# Patient Record
Sex: Female | Born: 1995 | Race: Black or African American | Hispanic: No | Marital: Single | Smoking: Former smoker
Health system: Southern US, Community
[De-identification: ages and names within clinical notes are randomized; demographics above are authoritative.]

## PROBLEM LIST (undated history)

## (undated) HISTORY — PX: WISDOM TOOTH EXTRACTION: SHX21

---

## 2016-08-15 ENCOUNTER — Encounter (HOSPITAL_BASED_OUTPATIENT_CLINIC_OR_DEPARTMENT_OTHER): Payer: Self-pay

## 2016-08-15 ENCOUNTER — Emergency Department (HOSPITAL_BASED_OUTPATIENT_CLINIC_OR_DEPARTMENT_OTHER): Payer: Medicaid Other

## 2016-08-15 ENCOUNTER — Emergency Department (HOSPITAL_BASED_OUTPATIENT_CLINIC_OR_DEPARTMENT_OTHER)
Admission: EM | Admit: 2016-08-15 | Discharge: 2016-08-16 | Disposition: A | Discharge status: Home / Self Care | Payer: Medicaid Other | Attending: Emergency Medicine | Admitting: Emergency Medicine

## 2016-08-15 DIAGNOSIS — R0602 Shortness of breath: Secondary | ICD-10-CM | POA: Insufficient documentation

## 2016-08-15 DIAGNOSIS — F17210 Nicotine dependence, cigarettes, uncomplicated: Secondary | ICD-10-CM | POA: Diagnosis not present

## 2016-08-15 DIAGNOSIS — R55 Syncope and collapse: Secondary | ICD-10-CM | POA: Insufficient documentation

## 2016-08-15 DIAGNOSIS — R109 Unspecified abdominal pain: Secondary | ICD-10-CM | POA: Diagnosis not present

## 2016-08-15 LAB — CBC WITH DIFFERENTIAL/PLATELET
Basophils Absolute: 0 10*3/uL (ref 0.0–0.1)
Basophils Relative: 0 %
EOS PCT: 0 %
Eosinophils Absolute: 0 10*3/uL (ref 0.0–0.7)
HCT: 43.6 % (ref 36.0–46.0)
Hemoglobin: 14.5 g/dL (ref 12.0–15.0)
LYMPHS ABS: 1.8 10*3/uL (ref 0.7–4.0)
LYMPHS PCT: 14 %
MCH: 31.3 pg (ref 26.0–34.0)
MCHC: 33.3 g/dL (ref 30.0–36.0)
MCV: 94.2 fL (ref 78.0–100.0)
MONO ABS: 1.2 10*3/uL — AB (ref 0.1–1.0)
Monocytes Relative: 9 %
Neutro Abs: 9.8 10*3/uL — ABNORMAL HIGH (ref 1.7–7.7)
Neutrophils Relative %: 77 %
PLATELETS: 277 10*3/uL (ref 150–400)
RBC: 4.63 MIL/uL (ref 3.87–5.11)
RDW: 12.5 % (ref 11.5–15.5)
WBC: 12.8 10*3/uL — ABNORMAL HIGH (ref 4.0–10.5)

## 2016-08-15 LAB — COMPREHENSIVE METABOLIC PANEL
ALT: 12 U/L — ABNORMAL LOW (ref 14–54)
AST: 20 U/L (ref 15–41)
Albumin: 4.2 g/dL (ref 3.5–5.0)
Alkaline Phosphatase: 53 U/L (ref 38–126)
Anion gap: 6 (ref 5–15)
BUN: 13 mg/dL (ref 6–20)
CHLORIDE: 104 mmol/L (ref 101–111)
CO2: 26 mmol/L (ref 22–32)
Calcium: 9.2 mg/dL (ref 8.9–10.3)
Creatinine, Ser: 0.6 mg/dL (ref 0.44–1.00)
GFR calc Af Amer: >60 mL/min (ref >60)
GFR calc non Af Amer: >60 mL/min (ref >60)
GLUCOSE: 117 mg/dL — AB (ref 65–99)
POTASSIUM: 3.7 mmol/L (ref 3.5–5.1)
Sodium: 136 mmol/L (ref 135–145)
Total Bilirubin: 0.6 mg/dL (ref 0.3–1.2)
Total Protein: 7.8 g/dL (ref 6.5–8.1)

## 2016-08-15 LAB — URINALYSIS, ROUTINE W REFLEX MICROSCOPIC
Bilirubin Urine: NEGATIVE
Glucose, UA: NEGATIVE mg/dL
Hgb urine dipstick: NEGATIVE
KETONES UR: NEGATIVE mg/dL
LEUKOCYTES UA: NEGATIVE
NITRITE: NEGATIVE
PROTEIN: NEGATIVE mg/dL
Specific Gravity, Urine: 1.028 (ref 1.005–1.030)
pH: 5.5 (ref 5.0–8.0)

## 2016-08-15 LAB — PREGNANCY, URINE: Preg Test, Ur: NEGATIVE

## 2016-08-15 LAB — CBG MONITORING, ED: GLUCOSE-CAPILLARY: 77 mg/dL (ref 65–99)

## 2016-08-15 MED ORDER — SODIUM CHLORIDE 0.9 % IV BOLUS (SEPSIS)
500.0000 mL | Freq: Once | INTRAVENOUS | Status: AC
  Administered 2016-08-15: 500 mL via INTRAVENOUS

## 2016-08-15 MED ORDER — IOPAMIDOL (ISOVUE-300) INJECTION 61%
100.0000 mL | Freq: Once | INTRAVENOUS | Status: AC | PRN
  Administered 2016-08-15: 100 mL via INTRAVENOUS

## 2016-08-15 MED ORDER — ONDANSETRON HCL 4 MG/2ML IJ SOLN
4.0000 mg | Freq: Once | INTRAMUSCULAR | Status: AC
  Administered 2016-08-15: 4 mg via INTRAVENOUS
  Filled 2016-08-15: qty 2

## 2016-08-15 NOTE — ED Provider Notes (Signed)
[facility]   CSN: [id number] Arrival date & time: 08/15/16  1859   By signing my name below, I, Clovis PuAvnee Patel, attest that this documentation has been prepared under the direction and in the presence of Tilden FossaElizabeth Floreen Teegarden, MD  Electronically Signed: Clovis PuAvnee Patel, ED Scribe. 08/15/16. 10:26 PM.   History   Chief Complaint Chief Complaint  Patient presents with  . Loss of Consciousness   The history is provided by the patient. No language interpreter was used.   HPI Comments:  Kimberly Bell is a 21 y.o. female who presents to the Emergency Department s/p a syncopal episode which occurred PTA. Pt states she felt hot before she passed out. She also reports rib pain, abdominal pain, SOB upon walking and notes she did not eat the normal amount of food that she usually does today. Her abdominal pain is worse upon palpation. No alleviating factors noted. Pt denies chest pain, leg swelling, any major medical problems, daily medication use, drug allergies or any other associated symptoms.   History reviewed. No pertinent past medical history.  There are no active problems to display for this patient.   Past Surgical History:  Procedure Laterality Date  . WISDOM TOOTH EXTRACTION      OB History    No data available       Home Medications    Prior to Admission medications   Not on File    Family History No family history on file.  Social History Social History  Substance Use Topics  . Smoking status: Current Every Day Smoker    Types: Cigarettes  . Smokeless tobacco: Never Used  . Alcohol use No     Allergies   Patient has no known allergies.   Review of Systems Review of Systems  Respiratory: Positive for shortness of breath.   Cardiovascular: Positive for syncope. Negative for chest pain and leg swelling.  Gastrointestinal: Positive for abdominal pain.  Musculoskeletal: Positive for myalgias.  Neurological: Positive for syncope.  All  other systems reviewed and are negative.  Physical Exam Updated Vital Signs BP 113/60 (BP Location: Right Arm)   Pulse 72   Temp 98.9 F (37.2 C) (Oral)   Resp 18   Ht 5\' 8"  (1.727 m)   Wt 125 lb (56.7 kg)   LMP 07/28/2016   SpO2 100%   BMI 19.01 kg/m   Physical Exam  Constitutional: She is oriented to person, place, and time. She appears well-developed and well-nourished.  HENT:  Head: Normocephalic and atraumatic.  Cardiovascular: Normal rate and regular rhythm.   No murmur heard. Pulmonary/Chest: Effort normal and breath sounds normal. No respiratory distress.  Abdominal: Soft. There is no tenderness. There is no rebound and no guarding.  Moderate right sided abdominal tenderness.   Musculoskeletal: She exhibits no edema or tenderness.  Neurological: She is alert and oriented to person, place, and time.  Skin: Skin is warm and dry.  Psychiatric: She has a normal mood and affect. Her behavior is normal.  Nursing note and vitals reviewed.  ED Treatments / Results  DIAGNOSTIC STUDIES:  Oxygen Saturation is 100% on RA, normal by my interpretation.    COORDINATION OF CARE:  10:25 PM Discussed treatment plan with pt at bedside and pt agreed to plan.  Labs (all labs ordered are listed, but only abnormal results are displayed) Labs Reviewed  COMPREHENSIVE METABOLIC PANEL - Abnormal; Notable for the following:       Result Value   Glucose, Bld 117 (*)  ALT 12 (*)    All other components within normal limits  CBC WITH DIFFERENTIAL/PLATELET - Abnormal; Notable for the following:    WBC 12.8 (*)    Neutro Abs 9.8 (*)    Monocytes Absolute 1.2 (*)    All other components within normal limits  PREGNANCY, URINE  URINALYSIS, ROUTINE W REFLEX MICROSCOPIC  CBG MONITORING, ED    EKG  EKG Interpretation  Date/Time:  Monday August 15 2016 19:28:37 EST Ventricular Rate:  81 PR Interval:  144 QRS Duration: 74 QT Interval:  360 QTC Calculation: 418 R Axis:   93 Text  Interpretation:  Normal sinus rhythm with sinus arrhythmia Possible Left atrial enlargement Rightward axis Borderline ECG Confirmed by Lincoln Brigham [phone]) on 08/15/2016 10:24:03 PM       Radiology No results found.  Procedures Procedures (including critical care time)  Medications Ordered in ED Medications  ondansetron (ZOFRAN) injection 4 mg (not administered)  iopamidol (ISOVUE-300) 61 % injection 100 mL (not administered)  sodium chloride 0.9 % bolus 500 mL (500 mLs Intravenous New Bag/Given 08/15/16 2241)     Initial Impression / Assessment and Plan / ED Course  I have reviewed the triage vital signs and the nursing notes.  Pertinent labs & imaging results that were available during my care of the patient were reviewed by me and considered in my medical decision making (see chart for details).     Patient here for a syncopal event while working. She had some nausea and right-sided abdominal pain on ED arrival. Her symptoms did improve after IV fluids and antiemetic. Current clinical picture is not consistent with PE. CT abdomen obtained given her leukocytosis and right-sided abdominal tenderness, CT is negative for acute [facility]. Consultation on home care for syncope as well as abdominal pain. Discussed importance of PCP follow-up and return precautions.  Final Clinical Impressions(s) / ED Diagnoses   Final diagnoses:  Syncope, unspecified syncope type    New Prescriptions New Prescriptions   No medications on file  I personally performed the services described in this documentation, which was scribed in my presence. The recorded information has been reviewed and is accurate.    Tilden Fossa, MD 08/16/16 [facility]

## 2016-08-15 NOTE — ED Triage Notes (Signed)
Pt states she was at work-she slumped over on co-worker then "came to"-did not fall to the ground-denies any recent illness-last po intake 245pm-NAD-steady gait

## 2018-03-13 ENCOUNTER — Ambulatory Visit (INDEPENDENT_AMBULATORY_CARE_PROVIDER_SITE_OTHER): Payer: PRIVATE HEALTH INSURANCE

## 2018-03-13 ENCOUNTER — Encounter (HOSPITAL_COMMUNITY): Payer: Self-pay | Admitting: Emergency Medicine

## 2018-03-13 ENCOUNTER — Ambulatory Visit (HOSPITAL_COMMUNITY)
Admission: EM | Admit: 2018-03-13 | Discharge: 2018-03-13 | Disposition: A | Discharge status: Home / Self Care | Payer: PRIVATE HEALTH INSURANCE | Attending: Family Medicine | Admitting: Family Medicine

## 2018-03-13 ENCOUNTER — Other Ambulatory Visit: Payer: Self-pay

## 2018-03-13 DIAGNOSIS — W228XXA Striking against or struck by other objects, initial encounter: Secondary | ICD-10-CM

## 2018-03-13 DIAGNOSIS — M25531 Pain in right wrist: Secondary | ICD-10-CM

## 2018-03-13 DIAGNOSIS — S60221A Contusion of right hand, initial encounter: Secondary | ICD-10-CM

## 2018-03-13 DIAGNOSIS — M79641 Pain in right hand: Secondary | ICD-10-CM

## 2018-03-13 MED ORDER — IBUPROFEN 600 MG PO TABS: 600.0000 mg | ORAL_TABLET | Freq: Four times a day (QID) | ORAL | 0 refills | Status: AC | PRN

## 2018-03-13 NOTE — ED Triage Notes (Signed)
Pt reports punching a wall last night.  She had swelling to the right hand and wrist last night.  She now has pain to the wrist and the top of her hand.

## 2018-03-13 NOTE — Discharge Instructions (Addendum)
No fracture or dislocation seen on x-ray Use anti-inflammatories for pain/swelling. You may take up to 800 mg Ibuprofen every 8 hours with food. You may supplement Ibuprofen with Tylenol [phone] mg every 8 hours.  Ice Would expect gradual improvement over the next 1 to 2 weeks

## 2018-03-13 NOTE — ED Provider Notes (Signed)
[facility]    CSN: [id number] Arrival date & time: 03/13/18  1140     History   Chief Complaint Chief Complaint  Patient presents with  . Hand Injury    right    HPI Kimberly Bell is a 22 y.o. female no significant past medical history presenting today for evaluation of right hand and wrist pain.  Patient states that she punched a wall last night.  Since she has had swelling and pain.  Had some slight numbness last night, but this is resolved.  Feels as if the swelling has improved.  She continues to have pain to her hand and wrist.  Has previous issues with this hand and wrist.  Patient is right-handed.  HPI  History reviewed. No pertinent past medical history.  There are no active problems to display for this patient.   Past Surgical History:  Procedure Laterality Date  . WISDOM TOOTH EXTRACTION      OB History   None      Home Medications    Prior to Admission medications   Medication Sig Start Date End Date Taking? Authorizing Provider  FLUoxetine (PROZAC) 10 MG capsule Take by mouth. 01/20/18  Yes [provider]  ibuprofen (ADVIL,MOTRIN) 600 MG tablet Take 1 tablet (600 mg total) by mouth every 6 (six) hours as needed. 03/13/18   Yakov Bergen, Junius Creamer, PA-C    Family History History reviewed. No pertinent family history.  Social History Social History   Tobacco Use  . Smoking status: Current Every Day Smoker    Types: Cigarettes  . Smokeless tobacco: Never Used  Substance Use Topics  . Alcohol use: No  . Drug use: No     Allergies   Patient has no known allergies.   Review of Systems Review of Systems  Constitutional: Negative for fatigue and fever.  Eyes: Negative for visual disturbance.  Respiratory: Negative for shortness of breath.   Cardiovascular: Negative for chest pain.  Gastrointestinal: Negative for abdominal pain, nausea and vomiting.  Musculoskeletal: Positive for arthralgias and myalgias. Negative for joint  swelling.  Skin: Negative for color change, rash and wound.  Neurological: Negative for dizziness, weakness, light-headedness and headaches.     Physical Exam Triage Vital Signs ED Triage Vitals  Enc Vitals Group     BP 03/13/18 1216 102/60     Pulse Rate 03/13/18 1216 88     Resp --      Temp 03/13/18 1216 98.5 F (36.9 C)     Temp Source 03/13/18 1216 Oral     SpO2 03/13/18 1216 97 %     Weight --      Height --      Head Circumference --      Peak Flow --      Pain Score 03/13/18 1211 7     Pain Loc --      Pain Edu? --      Excl. in GC? --    No data found.  Updated Vital Signs BP 102/60 (BP Location: Right Arm)   Pulse 88   Temp 98.5 F (36.9 C) (Oral)   LMP 02/24/2018 (Exact Date)   SpO2 97%   Visual Acuity Right Eye Distance:   Left Eye Distance:   Bilateral Distance:    Right Eye Near:   Left Eye Near:    Bilateral Near:     Physical Exam  Constitutional: She is oriented to person, place, and time. She appears well-developed and well-nourished.  No acute distress  HENT:  Head: Normocephalic and atraumatic.  Nose: Nose normal.  Eyes: Conjunctivae are normal.  Neck: Neck supple.  Cardiovascular: Normal rate.  Pulmonary/Chest: Effort normal. No respiratory distress.  Abdominal: She exhibits no distension.  Musculoskeletal: Normal range of motion.  Mild swelling to distal ulna on wrist, mild swelling overlying the distal second and third MCP, nontender to anatomical snuffbox, patient able to fully flex fingers at metacarpal/phalangeal joint  Radial pulse 2+; cap refill 2 seconds  Neurological: She is alert and oriented to person, place, and time.  Skin: Skin is warm and dry.  Psychiatric: She has a normal mood and affect.  Nursing note and vitals reviewed.    UC Treatments / Results  Labs (all labs ordered are listed, but only abnormal results are displayed) Labs Reviewed - No data to display  EKG None  Radiology Dg Hand Complete  Right  Result Date: 03/13/2018 CLINICAL DATA:  Right hand injury. EXAM: RIGHT HAND - COMPLETE 3+ VIEW COMPARISON:  No recent. FINDINGS: No acute bony or joint abnormality identified. No evidence of fracture or dislocation. Tiny sclerotic density noted in the distal radius consistent with bone island. IMPRESSION: No acute bony or joint abnormality identified. No evidence of fracture or dislocation. Electronically Signed   By: Maisie Fus  Register   On: 03/13/2018 12:52    Procedures Procedures (including critical care time)  Medications Ordered in UC Medications - No data to display  Initial Impression / Assessment and Plan / UC Course  I have reviewed the triage vital signs and the nursing notes.  Pertinent labs & imaging results that were available during my care of the patient were reviewed by me and considered in my medical decision making (see chart for details).     No fracture or dislocation noted on x-ray.  Will treat as contusion/sprain.  Ace wrap provided to provide support and compression to hand and wrist.  Anti-inflammatories discussed using Tylenol and ibuprofen, discussed increased risk of GI bleed with taking ibuprofen and fluoxetine together.  Ice.  Would expect gradual improvement over the next 1 to 2 weeks.  Follow-up if symptoms worsening or not improving.Discussed strict return precautions. Patient verbalized understanding and is agreeable with plan.  Final Clinical Impressions(s) / UC Diagnoses   Final diagnoses:  Contusion of right hand, initial encounter     Discharge Instructions     No fracture or dislocation seen on x-ray Use anti-inflammatories for pain/swelling. You may take up to 800 mg Ibuprofen every 8 hours with food. You may supplement Ibuprofen with Tylenol [phone] mg every 8 hours.  Ice Would expect gradual improvement over the next 1 to 2 weeks   ED Prescriptions    Medication Sig Dispense Auth. Provider   ibuprofen (ADVIL,MOTRIN) 600 MG tablet  Take 1 tablet (600 mg total) by mouth every 6 (six) hours as needed. 30 tablet Rocquel Askren, Cath[place] C, PA-C     Controlled Substance Prescriptions  Controlled Substance Registry consulted? Not Applicable   Lew Dawes, [place] 03/13/18 1357

## 2018-03-14 ENCOUNTER — Ambulatory Visit (HOSPITAL_COMMUNITY): Payer: PRIVATE HEALTH INSURANCE | Admitting: Psychiatry

## 2018-03-15 IMAGING — CT CT ABD-PELV W/ CM
2 of 4 series · 16 of 46 positions shown, 18 images · IV contrast (APPLIED)
Comparison: None.

CLINICAL DATA: Syncopal episode at [DATE].  Right-sided torso pain.

EXAM:
CT ABDOMEN AND PELVIS WITH CONTRAST
TECHNIQUE: Multidetector CT imaging of the abdomen and pelvis was performed
using the standard protocol following bolus administration of
intravenous contrast.
CONTRAST:  100mL DN4FFN-AXX IOPAMIDOL (DN4FFN-AXX) INJECTION 61%

[Series 2: axial st · axial · 0.62mm/px · z∈[-510,-76]mm · 13 of 95 slices shown, 15 images]
[im 4/95  soft-tissue]
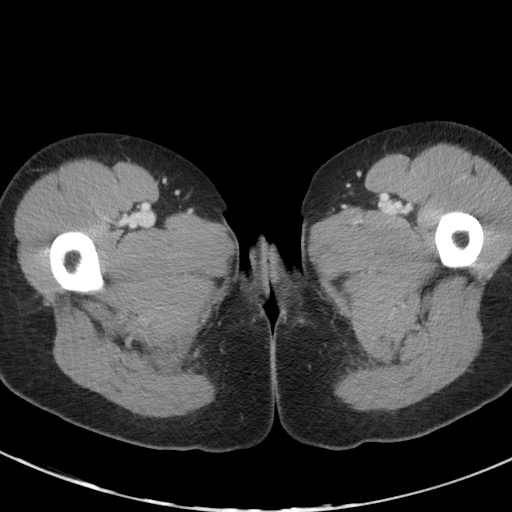
[im 4/95  bone]
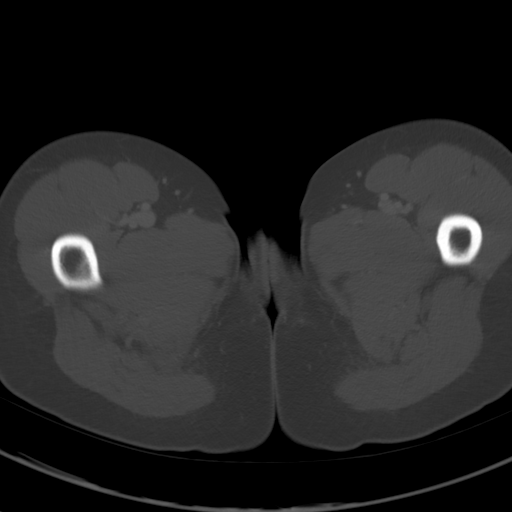
[im 12/95  soft-tissue]
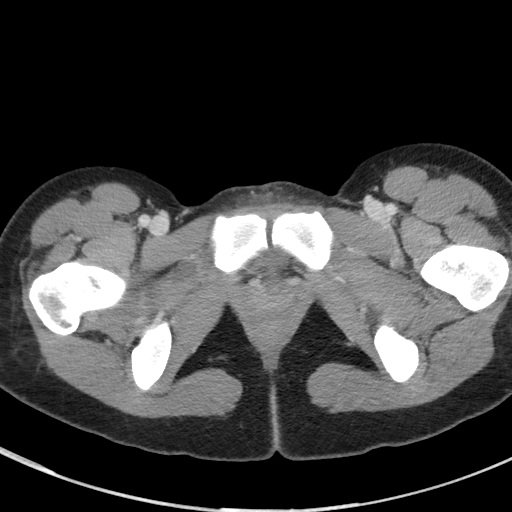
[im 20/95  soft-tissue]
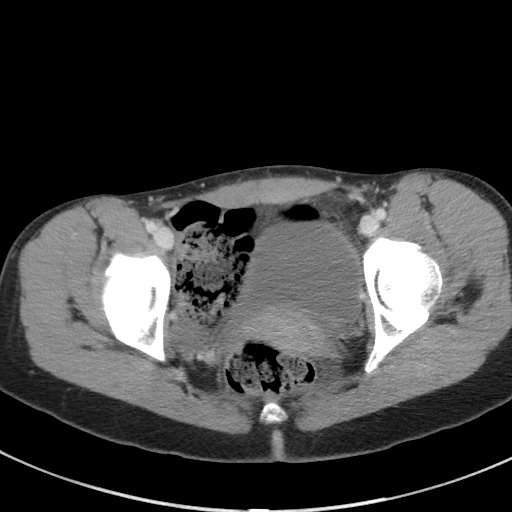
[im 28/95  soft-tissue]
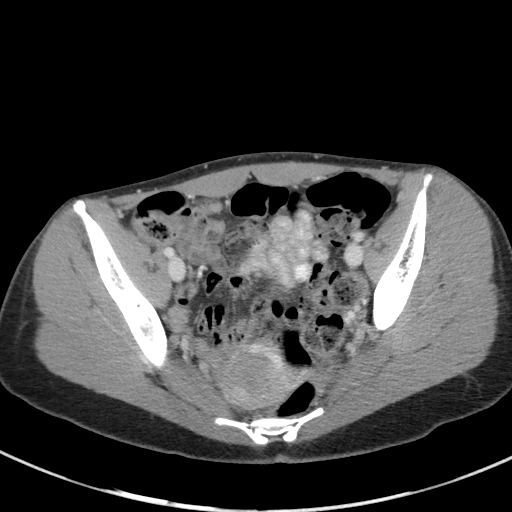
[im 32/95  soft-tissue]
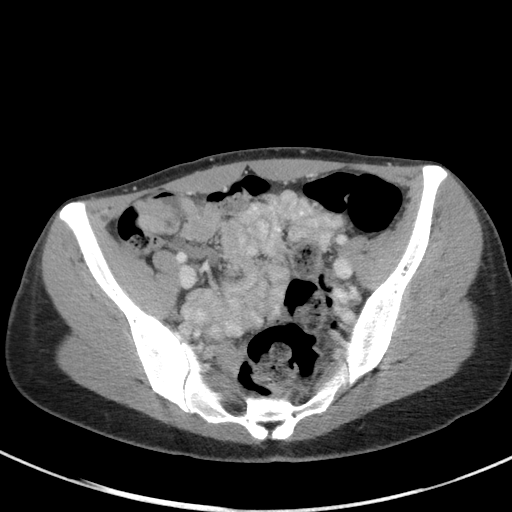
[im 40/95  soft-tissue]
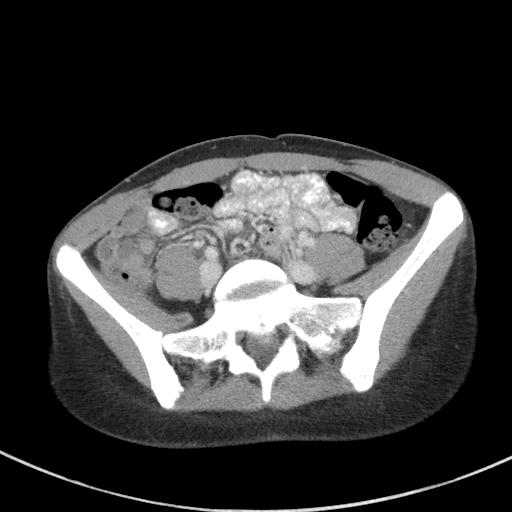
[im 48/95  soft-tissue]
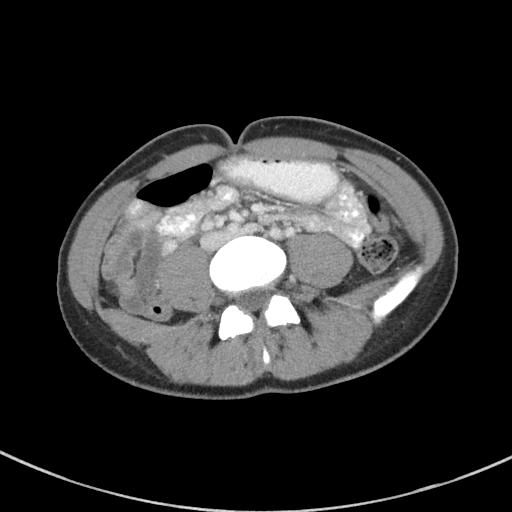
[im 55/95  soft-tissue]
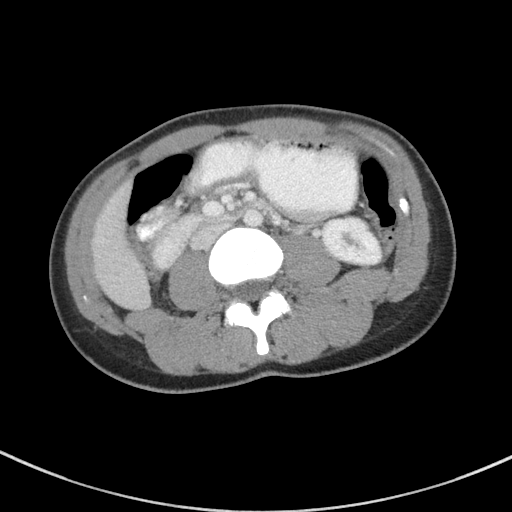
[im 63/95  soft-tissue]
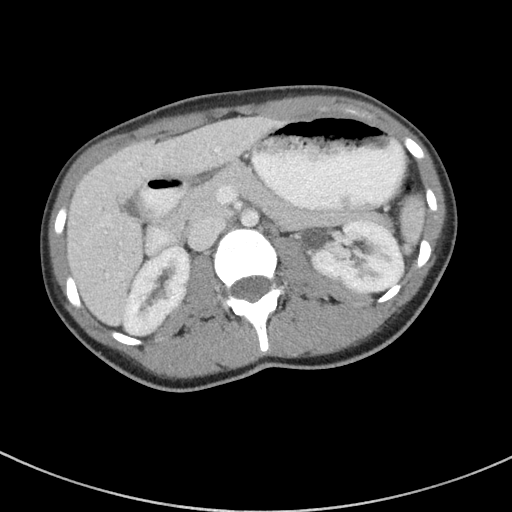
[im 63/95  bone]
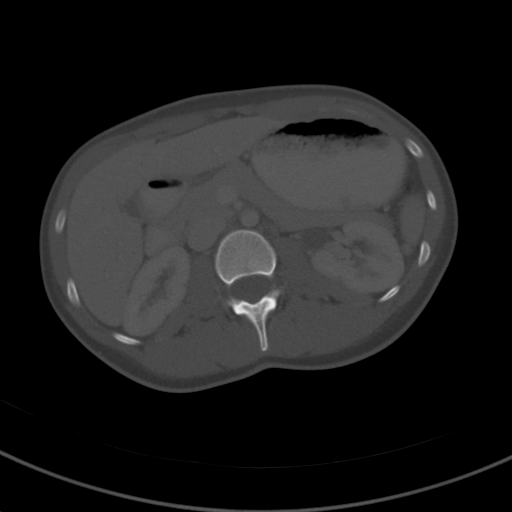
[im 67/95  soft-tissue]
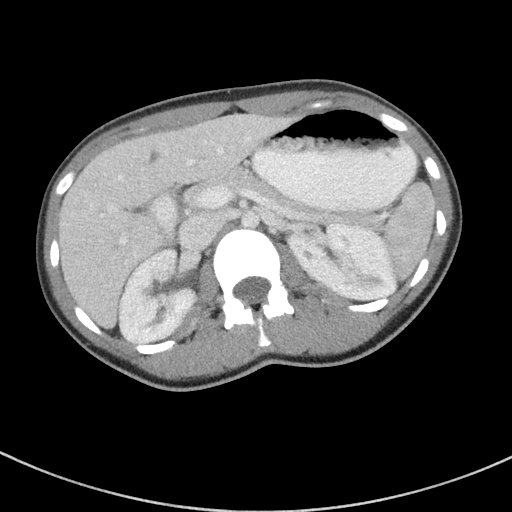
[im 75/95  soft-tissue]
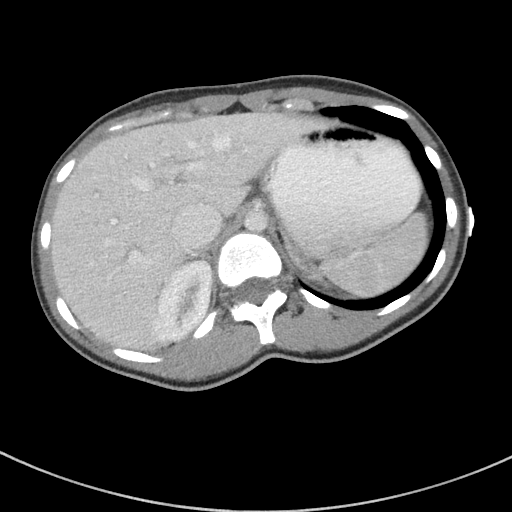
[im 83/95  soft-tissue]
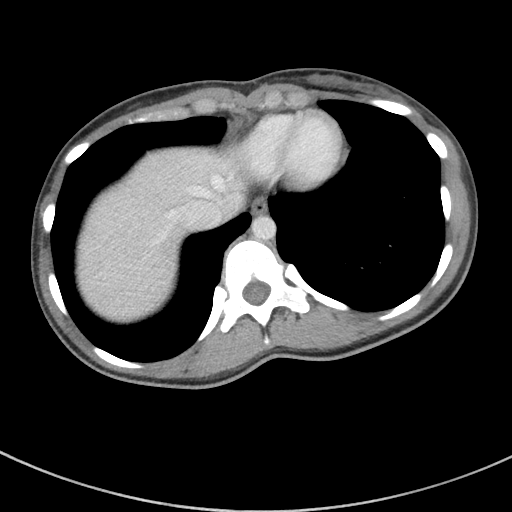
[im 91/95  soft-tissue]
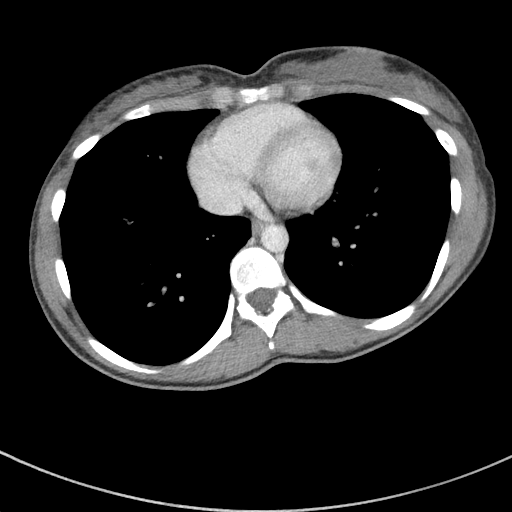

[Series 5: coronal st · coronal · 0.68mm/px · 3 of 61 slices shown]
[im 21/61  soft-tissue]
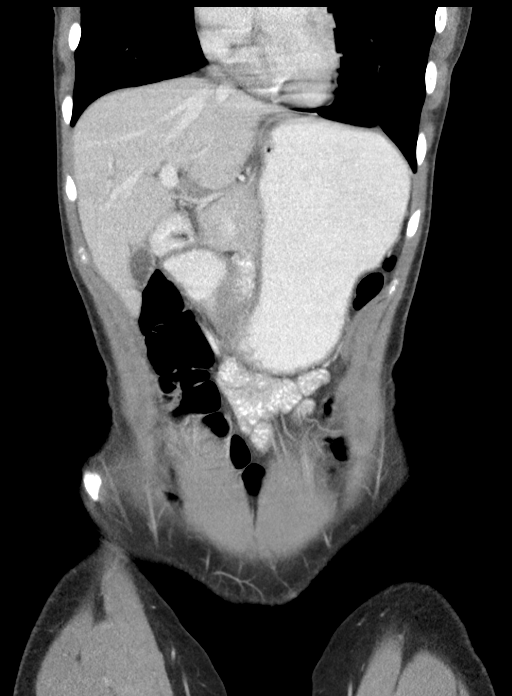
[im 27/61  soft-tissue]
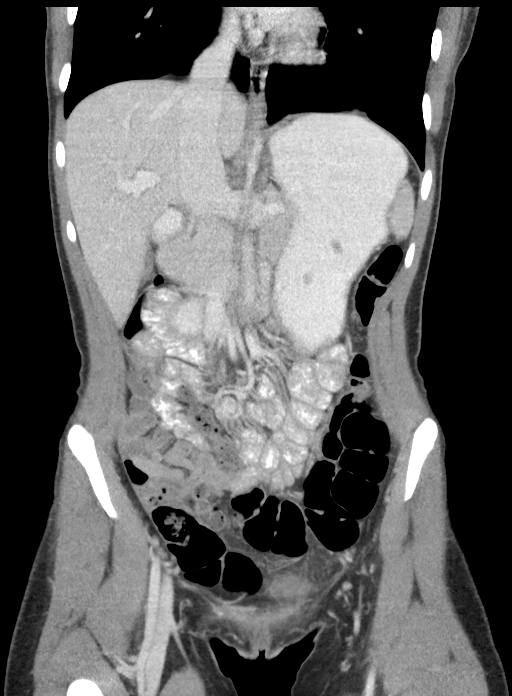
[im 34/61  soft-tissue]
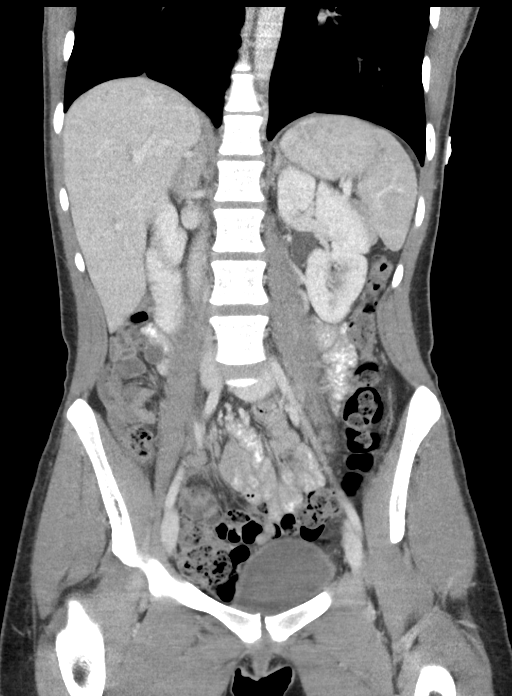

[16 of 46 positions shown; findings below may reference images not displayed]

FINDINGS: Lower chest: No acute abnormality.

Hepatobiliary: No focal liver abnormality is seen. No gallstones,
gallbladder wall thickening, or biliary dilatation.

Pancreas: Unremarkable. No pancreatic ductal dilatation or
surrounding inflammatory changes.

Spleen: Normal in size without focal abnormality.

Adrenals/Urinary Tract: Adrenal glands are unremarkable. Kidneys are
normal, without renal calculi, focal lesion, or hydronephrosis.
Bladder is unremarkable.

Stomach/Bowel: Stomach is within normal limits. Appendix is normal.
No evidence of bowel wall thickening, distention, or inflammatory
changes.

Vascular/Lymphatic: No significant vascular findings are present. No
enlarged abdominal or pelvic lymph nodes.

Reproductive: Uterus and bilateral adnexa are unremarkable.

Other: No acute inflammation.  No ascites.

Musculoskeletal: No fracture.  No significant skeletal lesion.
IMPRESSION: No significant abnormality.

## 2018-04-17 ENCOUNTER — Ambulatory Visit (HOSPITAL_COMMUNITY): Payer: Self-pay | Admitting: Psychiatry

## 2021-07-31 ENCOUNTER — Other Ambulatory Visit: Payer: Self-pay

## 2021-07-31 ENCOUNTER — Emergency Department (HOSPITAL_BASED_OUTPATIENT_CLINIC_OR_DEPARTMENT_OTHER)
Admission: EM | Admit: 2021-07-31 | Discharge: 2021-08-01 | Disposition: A | Discharge status: Home / Self Care | Payer: 59 | Attending: Emergency Medicine | Admitting: Emergency Medicine

## 2021-07-31 ENCOUNTER — Encounter (HOSPITAL_BASED_OUTPATIENT_CLINIC_OR_DEPARTMENT_OTHER): Payer: Self-pay | Admitting: *Deleted

## 2021-07-31 ENCOUNTER — Emergency Department (HOSPITAL_BASED_OUTPATIENT_CLINIC_OR_DEPARTMENT_OTHER): Payer: 59

## 2021-07-31 DIAGNOSIS — N83201 Unspecified ovarian cyst, right side: Secondary | ICD-10-CM

## 2021-07-31 DIAGNOSIS — R1031 Right lower quadrant pain: Secondary | ICD-10-CM | POA: Insufficient documentation

## 2021-07-31 DIAGNOSIS — Z20822 Contact with and (suspected) exposure to covid-19: Secondary | ICD-10-CM | POA: Diagnosis not present

## 2021-07-31 DIAGNOSIS — R1011 Right upper quadrant pain: Secondary | ICD-10-CM | POA: Insufficient documentation

## 2021-07-31 DIAGNOSIS — R197 Diarrhea, unspecified: Secondary | ICD-10-CM | POA: Diagnosis not present

## 2021-07-31 LAB — COMPREHENSIVE METABOLIC PANEL
ALT: 20 U/L (ref 0–44)
AST: 23 U/L (ref 15–41)
Albumin: 4.2 g/dL (ref 3.5–5.0)
Alkaline Phosphatase: 65 U/L (ref 38–126)
Anion gap: 7 (ref 5–15)
BUN: 11 mg/dL (ref 6–20)
CO2: 22 mmol/L (ref 22–32)
Calcium: 8.9 mg/dL (ref 8.9–10.3)
Chloride: 102 mmol/L (ref 98–111)
Creatinine, Ser: 0.67 mg/dL (ref 0.44–1.00)
GFR, Estimated: >60 mL/min (ref >60)
Glucose, Bld: 89 mg/dL (ref 70–99)
Potassium: 3.6 mmol/L (ref 3.5–5.1)
Sodium: 131 mmol/L — ABNORMAL LOW (ref 135–145)
Total Bilirubin: 0.8 mg/dL (ref 0.3–1.2)
Total Protein: 8.6 g/dL — ABNORMAL HIGH (ref 6.5–8.1)

## 2021-07-31 LAB — URINALYSIS, MICROSCOPIC (REFLEX)

## 2021-07-31 LAB — URINALYSIS, ROUTINE W REFLEX MICROSCOPIC
Bilirubin Urine: NEGATIVE
Glucose, UA: NEGATIVE mg/dL
Ketones, ur: NEGATIVE mg/dL
Leukocytes,Ua: NEGATIVE
Nitrite: NEGATIVE
Protein, ur: NEGATIVE mg/dL
Specific Gravity, Urine: >=1.03 (ref 1.005–1.030)
pH: 5.5 (ref 5.0–8.0)

## 2021-07-31 LAB — CBC
HCT: 46.5 % — ABNORMAL HIGH (ref 36.0–46.0)
Hemoglobin: 15.3 g/dL — ABNORMAL HIGH (ref 12.0–15.0)
MCH: 30.7 pg (ref 26.0–34.0)
MCHC: 32.9 g/dL (ref 30.0–36.0)
MCV: 93.2 fL (ref 80.0–100.0)
Platelets: 268 10*3/uL (ref 150–400)
RBC: 4.99 MIL/uL (ref 3.87–5.11)
RDW: 12.6 % (ref 11.5–15.5)
WBC: 9.3 10*3/uL (ref 4.0–10.5)
nRBC: 0 % (ref 0.0–0.2)

## 2021-07-31 LAB — PREGNANCY, URINE: Preg Test, Ur: NEGATIVE

## 2021-07-31 LAB — LIPASE, BLOOD: Lipase: 29 U/L (ref 11–51)

## 2021-07-31 MED ORDER — KETOROLAC TROMETHAMINE 30 MG/ML IJ SOLN
30.0000 mg | Freq: Once | INTRAMUSCULAR | Status: AC
  Administered 2021-08-01: 30 mg via INTRAVENOUS
  Filled 2021-07-31: qty 1

## 2021-07-31 MED ORDER — IOHEXOL 300 MG/ML  SOLN
100.0000 mL | Freq: Once | INTRAMUSCULAR | Status: AC | PRN
  Administered 2021-07-31: 100 mL via INTRAVENOUS

## 2021-07-31 NOTE — ED Triage Notes (Signed)
Pt reports diarrhea and right side abdominal pain today. She went to Urgent care and they advised her to come here to r/o appy

## 2021-08-01 ENCOUNTER — Encounter (HOSPITAL_BASED_OUTPATIENT_CLINIC_OR_DEPARTMENT_OTHER): Payer: Self-pay | Admitting: Emergency Medicine

## 2021-08-01 LAB — RESP PANEL BY RT-PCR (FLU A&B, COVID) ARPGX2
Influenza A by PCR: NEGATIVE
Influenza B by PCR: NEGATIVE
SARS Coronavirus 2 by RT PCR: NEGATIVE

## 2021-08-01 NOTE — ED Notes (Signed)
Patient updated on plan of care

## 2021-08-01 NOTE — ED Provider Notes (Signed)
[facility] Provider Note   CSN: [id number] Arrival date & time: 07/31/21  1952     History  Chief Complaint  Patient presents with   Abdominal Pain    Kimberly Bell is a 26 y.o. female.  The history is provided by the patient.  Abdominal Pain Pain location:  RLQ and RUQ Pain quality: cramping   Pain radiates to:  Does not radiate Pain severity:  Moderate Onset quality:  Gradual Duration:  1 day Timing:  Constant Progression:  Waxing and waning Chronicity:  New Context: sick contacts   Context comment:  Nephew with stomach bug and diarrhea Relieved by:  Nothing Worsened by:  Nothing Ineffective treatments:  None tried Associated symptoms: diarrhea   Associated symptoms: no anorexia, no belching, no chest pain, no chills, no constipation, no cough, no dysuria, no fatigue, no fever, no flatus, no hematemesis, no hematochezia, no hematuria, no melena, no nausea, no shortness of breath, no sore throat, no vaginal bleeding, no vaginal discharge and no vomiting   Risk factors: no alcohol abuse       Home Medications Prior to Admission medications   Medication Sig Start Date End Date Taking? Authorizing Provider  FLUoxetine (PROZAC) 10 MG capsule Take by mouth. 01/20/18   [provider]  ibuprofen (ADVIL,MOTRIN) 600 MG tablet Take 1 tablet (600 mg total) by mouth every 6 (six) hours as needed. 03/13/18   Wieters, Hallie C, PA-C      Allergies    Patient has no known allergies.    Review of Systems   Review of Systems  Constitutional:  Negative for chills, fatigue and fever.  HENT:  Negative for congestion and sore throat.   Eyes:  Negative for photophobia.  Respiratory:  Negative for cough and shortness of breath.   Cardiovascular:  Negative for chest pain.  Gastrointestinal:  Positive for abdominal pain and diarrhea. Negative for anorexia, constipation, flatus, hematemesis, hematochezia, melena, nausea and vomiting.   Genitourinary:  Negative for dysuria, hematuria, vaginal bleeding and vaginal discharge.  Neurological:  Negative for dizziness.  Psychiatric/Behavioral:  Negative for agitation.   All other systems reviewed and are negative.  Physical Exam Updated Vital Signs BP 122/74 (BP Location: Right Arm)    Pulse 86    Temp 99.6 F (37.6 C) (Oral)    Resp 16    Ht 5\' 9"  (1.753 m)    Wt 77.1 kg    LMP 06/28/2021 (Approximate)    SpO2 99%    BMI 25.10 kg/m  Physical Exam Vitals and nursing note reviewed.  Constitutional:      General: She is not in acute distress.    Appearance: Normal appearance.  HENT:     Head: Normocephalic and atraumatic.     Nose: Nose normal.  Eyes:     Conjunctiva/sclera: Conjunctivae normal.     Pupils: Pupils are equal, round, and reactive to light.  Cardiovascular:     Rate and Rhythm: Normal rate and regular rhythm.     Pulses: Normal pulses.     Heart sounds: Normal heart sounds.  Pulmonary:     Effort: Pulmonary effort is normal.     Breath sounds: Normal breath sounds.  Abdominal:     General: Abdomen is flat. Bowel sounds are normal.     Palpations: Abdomen is soft.     Tenderness: There is no abdominal tenderness. There is no guarding. Negative signs include Murphy's sign and McBurney's sign.     Comments: No  writhing and appears very comfortable on exam   Musculoskeletal:        General: Normal range of motion.     Cervical back: Normal range of motion and neck supple.  Skin:    General: Skin is warm and dry.     Capillary Refill: Capillary refill takes less than 2 seconds.  Neurological:     General: No focal deficit present.     Mental Status: She is alert and oriented to person, place, and time.     Deep Tendon Reflexes: Reflexes normal.  Psychiatric:        Mood and Affect: Mood normal.        Behavior: Behavior normal.    ED Results / Procedures / Treatments   Labs (all labs ordered are listed, but only abnormal results are  displayed) Labs Reviewed  COMPREHENSIVE METABOLIC PANEL - Abnormal; Notable for the following components:      Result Value   Sodium 131 (*)    Total Protein 8.6 (*)    All other components within normal limits  CBC - Abnormal; Notable for the following components:   Hemoglobin 15.3 (*)    HCT 46.5 (*)    All other components within normal limits  URINALYSIS, ROUTINE W REFLEX MICROSCOPIC - Abnormal; Notable for the following components:   Hgb urine dipstick TRACE (*)    All other components within normal limits  URINALYSIS, MICROSCOPIC (REFLEX) - Abnormal; Notable for the following components:   Bacteria, UA RARE (*)    All other components within normal limits  RESP PANEL BY RT-PCR (FLU A&B, COVID) ARPGX2  LIPASE, BLOOD  PREGNANCY, URINE    EKG None  Radiology CT ABDOMEN PELVIS W CONTRAST  Result Date: 07/31/2021 CLINICAL DATA:  Right-sided abdominal pain. EXAM: CT ABDOMEN AND PELVIS WITH CONTRAST TECHNIQUE: Multidetector CT imaging of the abdomen and pelvis was performed using the standard protocol following bolus administration of intravenous contrast. RADIATION DOSE REDUCTION: This exam was performed according to the departmental dose-optimization program which includes automated exposure control, adjustment of the mA and/or kV according to patient size and/or use of iterative reconstruction technique. CONTRAST:  OMNIPAQUE IOHEXOL 300 MG/ML  SOLN COMPARISON:  August 16, 2016 FINDINGS: Lower chest: No acute abnormality. Hepatobiliary: No focal liver abnormality is seen. No gallstones, gallbladder wall thickening, or biliary dilatation. Pancreas: Unremarkable. No pancreatic ductal dilatation or surrounding inflammatory changes. Spleen: Normal in size without focal abnormality. Adrenals/Urinary Tract: Adrenal glands are unremarkable. Kidneys are normal, without renal calculi, focal lesion, or hydronephrosis. Bladder is unremarkable. Stomach/Bowel: Stomach is within normal limits. The  appendix is normal in appearance (coronal reformatted images 44 through 50, CT series 5). No evidence of bowel wall thickening, distention, or inflammatory changes. Vascular/Lymphatic: No significant vascular findings are present. Subcentimeter mesenteric lymph nodes are seen within the mid and lower abdomen. Reproductive: The uterus is retroverted in position and otherwise normal in appearance. A 4.5 cm x 3.8 cm right adnexal cyst is noted. Other: No abdominal wall hernia or abnormality. No abdominopelvic ascites. Musculoskeletal: No acute or significant osseous findings. IMPRESSION: 4.5 cm x 3.8 cm right adnexal cyst, likely ovarian in origin. Electronically Signed   By: Aram Candela M.D.   On: 07/31/2021 23:44    Procedures Procedures    Medications Ordered in ED Medications  ketorolac (TORADOL) 30 MG/ML injection 30 mg (30 mg Intravenous Given 08/01/21 0010)  iohexol (OMNIPAQUE) 300 MG/ML solution 100 mL (100 mLs Intravenous Contrast Given 07/31/21 2329)  ED Course/ Medical Decision Making/ A&P                           Medical Decision Making Patient with abdominal pain and diarrhea with nephew with same at home but sent in by urgent care for rule out appendicitis   Amount and/or Complexity of Data Reviewed Independent Historian: parent    Details: see above Labs: ordered.    Details: Reviewed by me:  Normal CBC, normal electrolytes, negative covid and flu and pregnancy test Radiology: ordered.    Details: Ct scan reviewed by me without appendicitis  Risk Prescription drug management. Risk Details: Patient does not have appendicitis on exam or CT scan.  There is an ovarian cyst but pain is crampy and associated with diarrhea with a home sick contact with the same and this is likely the cause of symptoms.  Pain is resolved with toradol.  I do not believe this is torsion as pain is not intense or colicky and patient is not in acute distress she has been informed of the cyst and  need for close follow up with OB and has been given strict cyst return precautions    Final Clinical Impression(s) / ED Diagnoses Final diagnoses:  None  Return for intractable cough, coughing up blood, fevers > 100.4 unrelieved by medication, shortness of breath, intractable vomiting, chest pain, shortness of breath, weakness, numbness, changes in speech, facial asymmetry, abdominal pain, passing out, Inability to tolerate liquids or food, cough, altered mental status or any concerns. No signs of systemic illness or infection. The patient is nontoxic-appearing on exam and vital signs are within normal limits.  I have reviewed the triage vital signs and the nursing notes. Pertinent labs & imaging results that were available during my care of the patient were reviewed by me and considered in my medical decision making (see chart for details). After history, exam, and medical workup I feel the patient has been appropriately medically screened and is safe for discharge home. Pertinent diagnoses were discussed with the patient. Patient was given return precautions.    Rx / DC Orders ED Discharge Orders     None         Kimela Malstrom, MD 08/01/21 0134

## 2022-02-17 ENCOUNTER — Ambulatory Visit
Admission: EM | Admit: 2022-02-17 | Discharge: 2022-02-17 | Disposition: A | Discharge status: Home / Self Care | Payer: Commercial Managed Care - PPO | Attending: Urgent Care | Admitting: Urgent Care

## 2022-02-17 ENCOUNTER — Ambulatory Visit (HOSPITAL_BASED_OUTPATIENT_CLINIC_OR_DEPARTMENT_OTHER)
Admission: RE | Admit: 2022-02-17 | Discharge: 2022-02-17 | Disposition: A | Discharge status: Home / Self Care | Payer: Commercial Managed Care - PPO | Source: Ambulatory Visit | Attending: Urgent Care | Admitting: Urgent Care

## 2022-02-17 DIAGNOSIS — W19XXXA Unspecified fall, initial encounter: Secondary | ICD-10-CM | POA: Insufficient documentation

## 2022-02-17 DIAGNOSIS — M25561 Pain in right knee: Secondary | ICD-10-CM

## 2022-02-17 DIAGNOSIS — Y9368 Activity, volleyball (beach) (court): Secondary | ICD-10-CM | POA: Insufficient documentation

## 2022-02-17 DIAGNOSIS — S86911A Strain of unspecified muscle(s) and tendon(s) at lower leg level, right leg, initial encounter: Secondary | ICD-10-CM

## 2022-02-17 DIAGNOSIS — M25461 Effusion, right knee: Secondary | ICD-10-CM | POA: Diagnosis present

## 2022-02-17 MED ORDER — NAPROXEN 500 MG PO TABS: 500.0000 mg | ORAL_TABLET | Freq: Two times a day (BID) | ORAL | 0 refills | Status: AC

## 2022-02-17 NOTE — ED Provider Notes (Signed)
[facility]  Note:  This document was prepared using Conservation officer, historic buildings and may include unintentional dictation errors.  MRN: [id number] DOB: Aug 12, 1995  Subjective:   Kimberly Bell is a 26 y.o. female presenting for 2-day history of acute onset persistent right knee moderate pain with swelling.  Patient reports that she was wearing knee pads and doing some volleyball practice as a Psychologist, occupational.  She ended up diving for a ball and sliding toward it.  Made impact against her right knee.  She cannot recall if she specifically had all of her weight on it.  However since then its been bothering her.  She has been doing ibuprofen and icing without any relief.  She is able to bear weight but with pain.  No chronic medications.  No Known Allergies  History reviewed. No pertinent past medical history.   Past Surgical History:  Procedure Laterality Date   WISDOM TOOTH EXTRACTION      History reviewed. No pertinent family history.  Social History   Tobacco Use   Smoking status: Former    Types: Cigarettes   Smokeless tobacco: Never  Substance Use Topics   Alcohol use: No   Drug use: No    ROS   Objective:   Vitals: BP 112/71   Pulse 66   Temp 99 F (37.2 C)   Resp 16   LMP 02/06/2022 (Exact Date)   SpO2 96%   Physical Exam Constitutional:      General: She is not in acute distress.    Appearance: Normal appearance. She is well-developed. She is not ill-appearing, toxic-appearing or diaphoretic.  HENT:     Head: Normocephalic and atraumatic.     Nose: Nose normal.     Mouth/Throat:     Mouth: Mucous membranes are moist.  Eyes:     General: No scleral icterus.       Right eye: No discharge.        Left eye: No discharge.     Extraocular Movements: Extraocular movements intact.  Cardiovascular:     Rate and Rhythm: Normal rate.  Pulmonary:     Effort: Pulmonary effort is normal.  Musculoskeletal:     Right knee: Swelling and  crepitus (over inferior patella) present. No deformity, erythema, ecchymosis, lacerations or bony tenderness. Decreased range of motion (slight decrease in flexioni). Tenderness present over the patellar tendon. No medial joint line or lateral joint line tenderness. Normal alignment and normal patellar mobility.  Skin:    General: Skin is warm and dry.  Neurological:     General: No focal deficit present.     Mental Status: She is alert and oriented to person, place, and time.  Psychiatric:        Mood and Affect: Mood normal.        Behavior: Behavior normal.    I applied a 4 inch Ace wrap to the right knee.  Assessment and Plan :   PDMP not reviewed this encounter.  1. Acute pain of right knee   2. Knee strain, right, initial encounter    Recommended managing with RICE method for now for a right knee strain.  Naproxen for pain and inflammation.  Unfortunately our radiology technologist was pulled to a different site and therefore have to pursue an outpatient x-ray.  We will update treatment plan and diagnoses once we receive the radiology overread from the hospital. Counseled patient on potential for adverse effects with medications prescribed/recommended today, ER and return-to-clinic  precautions discussed, patient verbalized understanding.    Wallis Bamberg, [place] 02/17/22 [phone]

## 2022-02-17 NOTE — ED Triage Notes (Addendum)
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 she has been having constant right knee pain since. She has tx w/ ibuprofen and ice and no relief.

## 2023-09-02 ENCOUNTER — Ambulatory Visit
Admission: EM | Admit: 2023-09-02 | Discharge: 2023-09-02 | Disposition: A | Discharge status: Home / Self Care | Attending: Family Medicine | Admitting: Family Medicine

## 2023-09-02 ENCOUNTER — Other Ambulatory Visit: Payer: Self-pay

## 2023-09-02 DIAGNOSIS — N3000 Acute cystitis without hematuria: Secondary | ICD-10-CM | POA: Diagnosis present

## 2023-09-02 DIAGNOSIS — E860 Dehydration: Secondary | ICD-10-CM | POA: Diagnosis present

## 2023-09-02 LAB — POCT URINALYSIS DIP (MANUAL ENTRY)
Bilirubin, UA: NEGATIVE
Blood, UA: NEGATIVE
Glucose, UA: NEGATIVE mg/dL
Ketones, POC UA: NEGATIVE mg/dL
Nitrite, UA: NEGATIVE
Protein Ur, POC: NEGATIVE mg/dL
Spec Grav, UA: >=1.03 — AB (ref 1.010–1.025)
Urobilinogen, UA: 0.2 U/dL
pH, UA: 5.5 (ref 5.0–8.0)

## 2023-09-02 MED ORDER — CEFDINIR 300 MG PO CAPS: 300.0000 mg | ORAL_CAPSULE | Freq: Two times a day (BID) | ORAL | 0 refills | Status: AC

## 2023-09-02 NOTE — Discharge Instructions (Addendum)
 Advised patient UA revealed dehydration and trace leukocytes/UTI.  Instructed patient to take medication as directed with food to completion.  Encouraged increase daily water intake to 64 ounces per day while taking this medication.  Advised we will follow-up with urine culture results once received.  Advised if symptoms worsen and/or unresolved please follow-up with your PCP or here for further evaluation.

## 2023-09-02 NOTE — ED Provider Notes (Signed)
 [facility]    CSN: [id number] Arrival date & time: 09/02/23  1359      History   Chief Complaint Chief Complaint  Patient presents with   Dizziness    HPI Kimberly Bell is a 28 y.o. female.   HPI 28 year old female presents with 1 episode of dizziness that occurred 2 days ago on Thursday afternoon, 08/31/2023.  Patient reports episodes may last an hour.  Reports no history of these type of symptoms or vertigo.  PMH significant for obesity.  History reviewed. No pertinent past medical history.  There are no active problems to display for this patient.   Past Surgical History:  Procedure Laterality Date   WISDOM TOOTH EXTRACTION      OB History   No obstetric history on file.      Home Medications    Prior to Admission medications   Medication Sig Start Date End Date Taking? Authorizing Provider  cefdinir (OMNICEF) 300 MG capsule Take 1 capsule (300 mg total) by mouth 2 (two) times daily for 7 days. 09/02/23 09/09/23 Yes Trevor Iha, FNP  FLUoxetine (PROZAC) 10 MG capsule Take by mouth. 01/20/18   [provider]  ibuprofen (ADVIL,MOTRIN) 600 MG tablet Take 1 tablet (600 mg total) by mouth every 6 (six) hours as needed. 03/13/18   Wieters, Hallie C, PA-C  naproxen (NAPROSYN) 500 MG tablet Take 1 tablet (500 mg total) by mouth 2 (two) times daily with a meal. 02/17/22   Wallis Bamberg, PA-C    Family History History reviewed. No pertinent family history.  Social History Social History   Tobacco Use   Smoking status: Former    Types: Cigarettes   Smokeless tobacco: Never  Substance Use Topics   Alcohol use: No   Drug use: No     Allergies   Patient has no known allergies.   Review of Systems Review of Systems  Neurological:  Positive for dizziness.     Physical Exam Triage Vital Signs ED Triage Vitals  Encounter Vitals Group     BP      Systolic BP Percentile      Diastolic BP Percentile      Pulse      Resp      Temp       Temp src      SpO2      Weight      Height      Head Circumference      Peak Flow      Pain Score      Pain Loc      Pain Education      Exclude from Growth Chart    No data found.  Updated Vital Signs BP 99/63 (BP Location: Left Arm)   Pulse 81   Temp 98.1 F (36.7 C) (Oral)   Resp 17   LMP 08/20/2023 (Exact Date)   SpO2 97%    Physical Exam Vitals and nursing note reviewed.  Constitutional:      General: She is not in acute distress.    Appearance: Normal appearance. She is obese. She is not ill-appearing.  HENT:     Head: Normocephalic and atraumatic.     Right Ear: Tympanic membrane, ear canal and external ear normal.     Left Ear: Tympanic membrane, ear canal and external ear normal.     Mouth/Throat:     Mouth: Mucous membranes are moist.     Pharynx: Oropharynx is clear.  Eyes:  Extraocular Movements: Extraocular movements intact.     Conjunctiva/sclera: Conjunctivae normal.     Pupils: Pupils are equal, round, and reactive to light.  Cardiovascular:     Rate and Rhythm: Normal rate and regular rhythm.     Pulses: Normal pulses.     Heart sounds: Normal heart sounds.  Pulmonary:     Effort: Pulmonary effort is normal.     Breath sounds: Normal breath sounds. No wheezing, rhonchi or rales.  Abdominal:     Tenderness: There is no right CVA tenderness or left CVA tenderness.  Musculoskeletal:        General: Normal range of motion.     Cervical back: Normal range of motion and neck supple.  Skin:    General: Skin is warm and dry.  Neurological:     General: No focal deficit present.     Mental Status: She is alert and oriented to person, place, and time. Mental status is at baseline.  Psychiatric:        Mood and Affect: Mood normal.        Behavior: Behavior normal.      UC Treatments / Results  Labs (all labs ordered are listed, but only abnormal results are displayed) Labs Reviewed  POCT URINALYSIS DIP (MANUAL ENTRY) - Abnormal; Notable for  the following components:      Result Value   Clarity, UA hazy (*)    Spec Grav, UA >=1.030 (*)    Leukocytes, UA Trace (*)    All other components within normal limits  URINE CULTURE    EKG   Radiology No results found.  Procedures Procedures (including critical care time)  Medications Ordered in UC Medications - No data to display  Initial Impression / Assessment and Plan / UC Course  I have reviewed the triage vital signs and the nursing notes.  Pertinent labs & imaging results that were available during my care of the patient were reviewed by me and considered in my medical decision making (see chart for details).     MDM: 1.  Acute cystitis without hematuria-UA revealed above, Rx'd cefdinir 300 mg capsule: Take 1 capsule twice daily x 7 days, urine culture ordered; 2.  Dehydration-specific gravity revealed above advised patient probable source of dizziness especially dehydrated working in [employer] area. Advised patient UA revealed dehydration and trace leukocytes/UTI.  Instructed patient to take medication as directed with food to completion.  Encouraged increase daily water intake to 64 ounces per day while taking this medication.  Advised we will follow-up with urine culture results once received.  Advised if symptoms worsen and/or unresolved please follow-up with your PCP or here for further evaluation.  Work note provided to patient per her request.  Patient discharged home, Final Clinical Impressions(s) / UC Diagnoses   Final diagnoses:  Dehydration  Acute cystitis without hematuria     Discharge Instructions      Advised patient UA revealed dehydration and trace leukocytes/UTI.  Instructed patient to take medication as directed with food to completion.  Encouraged increase daily water intake to 64 ounces per day while taking this medication.  Advised we will follow-up with urine culture results once received.  Advised if symptoms worsen and/or unresolved please  follow-up with your PCP or here for further evaluation.     ED Prescriptions     Medication Sig Dispense Auth. Provider   cefdinir (OMNICEF) 300 MG capsule Take 1 capsule (300 mg total) by mouth 2 (two) times daily for  7 days. 14 capsule Trevor Iha, FNP      PDMP not reviewed this encounter.   Trevor Iha, FNP 09/02/23 1446

## 2023-09-02 NOTE — ED Triage Notes (Addendum)
 Pt c/o an episode of dizziness on Thurs afternoon at work. Happened again today at work. Episodes last about an hour. No previous hx of vertigo.

## 2023-09-03 LAB — URINE CULTURE: Culture: 10000 — AB

## 2024-02-07 ENCOUNTER — Emergency Department (HOSPITAL_BASED_OUTPATIENT_CLINIC_OR_DEPARTMENT_OTHER)
Admission: EM | Admit: 2024-02-07 | Discharge: 2024-02-07 | Disposition: A | Discharge status: Home / Self Care | Payer: Self-pay | Attending: Emergency Medicine | Admitting: Emergency Medicine

## 2024-02-07 ENCOUNTER — Other Ambulatory Visit: Payer: Self-pay

## 2024-02-07 DIAGNOSIS — R319 Hematuria, unspecified: Secondary | ICD-10-CM | POA: Diagnosis present

## 2024-02-07 DIAGNOSIS — N3001 Acute cystitis with hematuria: Secondary | ICD-10-CM | POA: Diagnosis not present

## 2024-02-07 LAB — URINALYSIS, ROUTINE W REFLEX MICROSCOPIC
Glucose, UA: NEGATIVE mg/dL
Ketones, ur: NEGATIVE mg/dL
Nitrite: POSITIVE — AB
Protein, ur: >=300 mg/dL — AB
Specific Gravity, Urine: >=1.03 (ref 1.005–1.030)
pH: 5.5 (ref 5.0–8.0)

## 2024-02-07 LAB — URINALYSIS, MICROSCOPIC (REFLEX)
RBC / HPF: >50 RBC/hpf (ref 0–5)
WBC, UA: >50 WBC/hpf (ref 0–5)

## 2024-02-07 LAB — PREGNANCY, URINE: Preg Test, Ur: NEGATIVE

## 2024-02-07 MED ORDER — CEPHALEXIN 250 MG PO CAPS
500.0000 mg | ORAL_CAPSULE | Freq: Once | ORAL | Status: AC
  Administered 2024-02-07: 500 mg via ORAL
  Filled 2024-02-07: qty 2

## 2024-02-07 MED ORDER — CEPHALEXIN 500 MG PO CAPS: 500.0000 mg | ORAL_CAPSULE | Freq: Two times a day (BID) | ORAL | 0 refills | Status: AC

## 2024-02-07 MED ORDER — PHENAZOPYRIDINE HCL 100 MG PO TABS
95.0000 mg | ORAL_TABLET | Freq: Once | ORAL | Status: AC
  Administered 2024-02-07: 100 mg via ORAL
  Filled 2024-02-07: qty 1

## 2024-02-07 NOTE — ED Triage Notes (Signed)
 Pt POV d/t dysuria for 2-3 hours - hematuria, burning and frequency.

## 2024-02-07 NOTE — ED Provider Notes (Signed)
 [facility]  Provider Note  CSN: [id number] Arrival date & time: 02/07/24 0020  History Chief Complaint  Patient presents with   Hematuria    Kimberly Bell is a 28 y.o. female reports recent polyuria and then tonight began having dysuria and hematuria with some suprapubic pain. No fever, vomiting or flank pain. No history of UTI. Not sexually active.    Home Medications Prior to Admission medications   Medication Sig Start Date End Date Taking? Authorizing Provider  cephALEXin  (KEFLEX ) 500 MG capsule Take 1 capsule (500 mg total) by mouth 2 (two) times daily for 7 days. 02/07/24 02/14/24 Yes Roselyn Carlin NOVAK, MD  FLUoxetine (PROZAC) 10 MG capsule Take by mouth. 01/20/18   [provider]  ibuprofen  (ADVIL ,MOTRIN ) 600 MG tablet Take 1 tablet (600 mg total) by mouth every 6 (six) hours as needed. 03/13/18   Wieters, Hallie C, PA-C  naproxen  (NAPROSYN ) 500 MG tablet Take 1 tablet (500 mg total) by mouth 2 (two) times daily with a meal. 02/17/22   Christopher Savannah, PA-C     Allergies    Patient has no known allergies.   Review of Systems   Review of Systems Please see HPI for pertinent positives and negatives  Physical Exam BP (!) 127/92   Pulse 84   Temp 98.4 F (36.9 C)   Resp 18   Ht 5' 9 (1.753 m)   Wt 66.7 kg   LMP 01/06/2024   SpO2 99%   BMI 21.71 kg/m   Physical Exam Vitals and nursing note reviewed.  Constitutional:      Appearance: Normal appearance.  HENT:     Head: Normocephalic and atraumatic.     Nose: Nose normal.     Mouth/Throat:     Mouth: Mucous membranes are moist.  Eyes:     Extraocular Movements: Extraocular movements intact.     Conjunctiva/sclera: Conjunctivae normal.  Cardiovascular:     Rate and Rhythm: Normal rate.  Pulmonary:     Effort: Pulmonary effort is normal.     Breath sounds: Normal breath sounds.  Abdominal:     General: Abdomen is flat.     Palpations: Abdomen is soft.      Tenderness: There is abdominal tenderness (mild, suprapubic). There is no guarding.  Musculoskeletal:        General: No swelling. Normal range of motion.     Cervical back: Neck supple.  Skin:    General: Skin is warm and dry.  Neurological:     General: No focal deficit present.     Mental Status: She is alert.  Psychiatric:        Mood and Affect: Mood normal.     ED Results / Procedures / Treatments   EKG None  Procedures Procedures  Medications Ordered in the ED Medications  cephALEXin  (KEFLEX ) capsule 500 mg (has no administration in time range)  phenazopyridine  (PYRIDIUM ) tablet 100 mg (has no administration in time range)    Initial Impression and Plan  Patient here with UTI symptoms, vitals and exam are reassuring. No signs of pyelo, doubt renal stone/obstruction. UA is consistent with UTI. Will treat with Keflex , AZO as needed. PCP follow up, RTED for any other concerns.    ED Course       MDM Rules/Calculators/A&P Medical Decision Making Problems Addressed: Acute cystitis with hematuria: acute illness or injury  Amount and/or Complexity of Data Reviewed Labs: ordered. Decision-making details documented in ED Course.  Risk Prescription drug management.     Final Clinical Impression(s) / ED Diagnoses Final diagnoses:  Acute cystitis with hematuria    Rx / DC Orders ED Discharge Orders          Ordered    cephALEXin  (KEFLEX ) 500 MG capsule  2 times daily        02/07/24 0118             Roselyn Carlin NOVAK, MD 02/07/24 [phone]

## 2024-02-09 LAB — URINE CULTURE: Culture: 50000 — AB

## 2024-02-10 ENCOUNTER — Telehealth (HOSPITAL_BASED_OUTPATIENT_CLINIC_OR_DEPARTMENT_OTHER): Payer: Self-pay | Admitting: *Deleted

## 2024-02-10 NOTE — Telephone Encounter (Signed)
 Post ED Visit - Positive Culture Follow-up  Culture report reviewed by antimicrobial stewardship pharmacist: [facility] []  Rankin Dee, Pharm.D. []  Venetia Gully, Pharm.D., BCPS AQ-ID []  Garrel Crews, Pharm.D., BCPS []  Almarie Lunger, Pharm.D., BCPS []  [place], [place].D., BCPS, AAHIVP []  Rosaline Bihari, Pharm.D., BCPS, AAHIVP []  Vernell Meier, PharmD, BCPS []  Latanya Hint, PharmD, BCPS []  Donald Medley, PharmD, BCPS []  Rocky Bold, PharmD []  Dorothyann Alert, PharmD, BCPS [x]  Dorn Buttner, PharmD  [facility] []  Rosaline Edison, PharmD []  Romona Bliss, PharmD []  Dolphus Roller, PharmD []  Veva Seip, Rph []  Vernell Daunt) Leonce, PharmD []  Eva Allis, PharmD []  Rosaline Millet, PharmD []  Iantha Batch, PharmD []  Arvin Gauss, PharmD []  Wanda Hasting, PharmD []  Ronal Rav, PharmD []  Rocky Slade, PharmD []  Bard Jeans, PharmD   Positive urine culture Treated with Cephalexin , organism sensitive to the same and no further patient follow-up is required at this time.  Albino Alan Novak 02/10/2024, 2:36 PM
